# Patient Record
Sex: Female | Born: 1960 | Race: White | Hispanic: No | Marital: Married | Smoking: Never smoker
Health system: Southern US, Community
[De-identification: ages and names within clinical notes are randomized; demographics above are authoritative.]

## PROBLEM LIST (undated history)

## (undated) DIAGNOSIS — I10 Essential (primary) hypertension: Secondary | ICD-10-CM

## (undated) HISTORY — DX: Essential (primary) hypertension: I10

---

## 2004-01-07 ENCOUNTER — Ambulatory Visit (HOSPITAL_COMMUNITY): Admission: RE | Admit: 2004-01-07 | Discharge: 2004-01-07 | Payer: Self-pay | Admitting: Obstetrics & Gynecology

## 2008-01-16 ENCOUNTER — Ambulatory Visit (HOSPITAL_COMMUNITY): Admission: RE | Admit: 2008-01-16 | Discharge: 2008-01-16 | Payer: Self-pay | Admitting: Obstetrics and Gynecology

## 2008-06-25 ENCOUNTER — Ambulatory Visit (HOSPITAL_COMMUNITY): Admission: RE | Admit: 2008-06-25 | Discharge: 2008-06-25 | Payer: Self-pay | Admitting: Obstetrics & Gynecology

## 2008-10-28 ENCOUNTER — Ambulatory Visit: Payer: Self-pay | Admitting: Orthopedic Surgery

## 2008-10-28 DIAGNOSIS — M7500 Adhesive capsulitis of unspecified shoulder: Secondary | ICD-10-CM | POA: Insufficient documentation

## 2008-11-08 ENCOUNTER — Encounter: Payer: Self-pay | Admitting: Orthopedic Surgery

## 2008-11-08 ENCOUNTER — Encounter (HOSPITAL_COMMUNITY): Admission: RE | Admit: 2008-11-08 | Discharge: 2008-11-24 | Payer: Self-pay | Admitting: Orthopedic Surgery

## 2008-11-26 ENCOUNTER — Encounter (HOSPITAL_COMMUNITY): Admission: RE | Admit: 2008-11-26 | Discharge: 2008-12-26 | Payer: Self-pay | Admitting: Orthopedic Surgery

## 2008-12-06 ENCOUNTER — Encounter: Payer: Self-pay | Admitting: Orthopedic Surgery

## 2008-12-13 ENCOUNTER — Ambulatory Visit: Payer: Self-pay | Admitting: Orthopedic Surgery

## 2008-12-27 ENCOUNTER — Encounter (HOSPITAL_COMMUNITY): Admission: RE | Admit: 2008-12-27 | Discharge: 2009-01-26 | Payer: Self-pay | Admitting: Orthopedic Surgery

## 2008-12-31 ENCOUNTER — Encounter: Payer: Self-pay | Admitting: Orthopedic Surgery

## 2009-01-17 ENCOUNTER — Encounter: Payer: Self-pay | Admitting: Orthopedic Surgery

## 2009-02-14 ENCOUNTER — Ambulatory Visit: Payer: Self-pay | Admitting: Orthopedic Surgery

## 2010-03-28 NOTE — Miscellaneous (Signed)
Summary: PT intial eval.  PT intial eval.   Imported By: Curtis Sites 11/25/2008 08:28:13  _____________________________________________________________________  External Attachment:    Type:   Image     Comment:   External Document

## 2012-08-26 ENCOUNTER — Ambulatory Visit: Payer: 59

## 2012-08-26 ENCOUNTER — Ambulatory Visit (INDEPENDENT_AMBULATORY_CARE_PROVIDER_SITE_OTHER): Payer: 59 | Admitting: Orthopedic Surgery

## 2012-08-26 VITALS — BP 140/82 | Ht 67.0 in | Wt 147.0 lb

## 2012-08-26 DIAGNOSIS — M75102 Unspecified rotator cuff tear or rupture of left shoulder, not specified as traumatic: Secondary | ICD-10-CM

## 2012-08-26 DIAGNOSIS — M719 Bursopathy, unspecified: Secondary | ICD-10-CM

## 2012-08-26 DIAGNOSIS — M25519 Pain in unspecified shoulder: Secondary | ICD-10-CM

## 2012-08-26 DIAGNOSIS — M25512 Pain in left shoulder: Secondary | ICD-10-CM

## 2012-08-26 NOTE — Patient Instructions (Addendum)
You have received a steroid shot. 15% of patients experience increased pain at the injection site with in the next 24 hours. This is best treated with ice and tylenol extra strength 2 tabs every 8 hours. If you are still having pain please call the office.  HOME EXERICSES

## 2012-08-27 DIAGNOSIS — M25512 Pain in left shoulder: Secondary | ICD-10-CM | POA: Insufficient documentation

## 2012-08-27 DIAGNOSIS — M75102 Unspecified rotator cuff tear or rupture of left shoulder, not specified as traumatic: Secondary | ICD-10-CM | POA: Insufficient documentation

## 2012-08-27 NOTE — Progress Notes (Signed)
Patient ID: Gina Reid, female   DOB: 02/11/1961, 52 y.o.   MRN: [id number] Chief Complaint  Patient presents with  . Shoulder Pain    Left shoulder pain and stiffness    History this is a 52 year old female previous history of adhesive capsulitis of left shoulder presents with left shoulder pain and stiffness since December with no history of trauma. She is sharp pain with certain movements it appears to bother her more at when she reaches behind her, internal rotation, she has lost some external rotation. She has no weakness she does complain of 7/10 pain but does not like taking medicine even Tylenol or Advil  Review of systems is a very healthy person she has a negative review of systems.  No allergies  No medical problems  No previous surgeries  Medication is Loestrin  Family history negative  Married, Manufacturing systems engineer. She does not smoke rarely drinks has a little bit of caffeine has her 4 year college degree no street drugs reported.  Vital signs are stable normal ectomorphic body habitus BP 140/82  Ht 5\' 7"  (1.702 m)  Wt 147 lb (66.679 kg)  BMI 23.02 kg/m2  LMP 08/26/2012  General appearance: Development, nutrition are normal. Body habitus ectomorphic No gross deformities are noted and grooming normal.  Peripheral vascular system no swelling or varicose veins are noted and pulses are palpable without tenderness, temperature warm to touch no edema.  No palpable lymph nodes are noted in the cervical area or axillae.  The skin overlying the right and left shoulder cervical and thoracic spine is normal without rash, lesion or ulceration  . And pathologic reflexes such as Hoffman sign are negative.  Sensation remains normal.  The patient is oriented to person place and time, the mood and affect are normal  Ambulation remains normal  Cervical spine no mass or tenderness. Range of motion is normal. Muscle tone is normal. Skin is normal.  Right shoulder inspection  reveals no tenderness or malalignment. There is no crepitation. The range of motion remains full flexion internal and external rotation. Stability tests are normal in abduction external rotation inferior subluxation test as well as the posterior stress test. Manual muscle testing of the supraspinatus, internal and external rotators 5 over 5  Impingement sign is normal, Hawkins maneuver normal, a.c. joint stress test normal.  Left shoulder  inspection reveals no tenderness or malalignment. There is no crepitation. The range of motion remains full flexion internal and external rotation. Stability tests are normal in abduction external rotation inferior subluxation test as well as the posterior stress test. Manual muscle testing of the supraspinatus, internal and external rotators 5 over 5  Impingement sign is normal, Hawkins maneuver normal, a.c. joint stress test normal.  X-rays are negative. Impingement syndrome left shoulder  Patient prefers to do home exercise program which we reviewed. We gave her a subacromial injection she refuses any medication.  Followup 6 weeks check range of motion especially internal rotation  Subacromial Shoulder Injection Procedure Note  Pre-operative Diagnosis: left RC Syndrome  Post-operative Diagnosis: same  Indications: pain   Anesthesia: ethyl chloride   Procedure Details   Verbal consent was obtained for the procedure. The shoulder was prepped withalcohol and the skin was anesthetized. A 20 gauge needle was advanced into the subacromial space through posterior approach without difficulty  The space was then injected with 3 ml 1% lidocaine and 1 ml of depomedrol. The injection site was cleansed with isopropyl alcohol and a dressing was applied.  Complications:  None; patient tolerated the procedure well.

## 2012-10-09 ENCOUNTER — Ambulatory Visit: Payer: 59 | Admitting: Orthopedic Surgery

## 2013-04-17 ENCOUNTER — Other Ambulatory Visit: Payer: Self-pay | Admitting: Adult Health

## 2013-04-17 DIAGNOSIS — Z1231 Encounter for screening mammogram for malignant neoplasm of breast: Secondary | ICD-10-CM

## 2013-04-21 ENCOUNTER — Ambulatory Visit (HOSPITAL_COMMUNITY)
Admission: RE | Admit: 2013-04-21 | Discharge: 2013-04-21 | Disposition: A | Discharge status: Home / Self Care | Payer: 59 | Source: Ambulatory Visit | Attending: Adult Health | Admitting: Adult Health

## 2013-04-21 DIAGNOSIS — Z1231 Encounter for screening mammogram for malignant neoplasm of breast: Secondary | ICD-10-CM | POA: Insufficient documentation

## 2013-04-22 ENCOUNTER — Telehealth: Payer: Self-pay | Admitting: Adult Health

## 2013-04-22 NOTE — Telephone Encounter (Signed)
Pt aware mammogram normal 

## 2014-04-12 ENCOUNTER — Other Ambulatory Visit: Payer: Self-pay | Admitting: Adult Health

## 2014-04-12 DIAGNOSIS — Z1231 Encounter for screening mammogram for malignant neoplasm of breast: Secondary | ICD-10-CM

## 2014-05-14 ENCOUNTER — Ambulatory Visit (HOSPITAL_COMMUNITY)
Admission: RE | Admit: 2014-05-14 | Discharge: 2014-05-14 | Disposition: A | Discharge status: Home / Self Care | Payer: 59 | Source: Ambulatory Visit | Attending: Adult Health | Admitting: Adult Health

## 2014-05-14 DIAGNOSIS — Z1231 Encounter for screening mammogram for malignant neoplasm of breast: Secondary | ICD-10-CM | POA: Insufficient documentation

## 2015-04-08 ENCOUNTER — Other Ambulatory Visit: Payer: Self-pay | Admitting: Adult Health

## 2015-04-08 DIAGNOSIS — Z1231 Encounter for screening mammogram for malignant neoplasm of breast: Secondary | ICD-10-CM

## 2015-05-16 ENCOUNTER — Ambulatory Visit (HOSPITAL_COMMUNITY)
Admission: RE | Admit: 2015-05-16 | Discharge: 2015-05-16 | Disposition: A | Discharge status: Home / Self Care | Payer: 59 | Source: Ambulatory Visit | Attending: Adult Health | Admitting: Adult Health

## 2015-05-16 DIAGNOSIS — R928 Other abnormal and inconclusive findings on diagnostic imaging of breast: Secondary | ICD-10-CM | POA: Insufficient documentation

## 2015-05-16 DIAGNOSIS — Z1231 Encounter for screening mammogram for malignant neoplasm of breast: Secondary | ICD-10-CM | POA: Insufficient documentation

## 2015-05-17 ENCOUNTER — Other Ambulatory Visit: Payer: Self-pay | Admitting: Adult Health

## 2015-05-17 DIAGNOSIS — R928 Other abnormal and inconclusive findings on diagnostic imaging of breast: Secondary | ICD-10-CM

## 2015-05-24 ENCOUNTER — Ambulatory Visit (HOSPITAL_COMMUNITY)
Admission: RE | Admit: 2015-05-24 | Discharge: 2015-05-24 | Disposition: A | Discharge status: Home / Self Care | Payer: 59 | Source: Ambulatory Visit | Attending: Adult Health | Admitting: Adult Health

## 2015-05-24 DIAGNOSIS — R928 Other abnormal and inconclusive findings on diagnostic imaging of breast: Secondary | ICD-10-CM | POA: Diagnosis not present

## 2015-07-20 ENCOUNTER — Other Ambulatory Visit: Payer: Self-pay | Admitting: Women's Health

## 2015-07-20 MED ORDER — METRONIDAZOLE 0.75 % VA GEL: 1.0000 | Freq: Every day | VAGINAL | Status: DC

## 2015-07-20 NOTE — Telephone Encounter (Signed)
Complaint of a yellowish malodorous vaginal discharge, started a few days ago, post coital

## 2016-08-09 ENCOUNTER — Telehealth: Payer: Self-pay | Admitting: Adult Health

## 2016-08-09 MED ORDER — NORETHIN-ETH ESTRAD-FE BIPHAS 1 MG-10 MCG / 10 MCG PO TABS: 1.0000 | ORAL_TABLET | Freq: Every day | ORAL | 11 refills | Status: DC

## 2016-08-09 NOTE — Telephone Encounter (Signed)
Needs refill on lo loestrin, [facility]

## 2017-03-08 ENCOUNTER — Other Ambulatory Visit: Payer: Self-pay | Admitting: Adult Health

## 2017-03-08 DIAGNOSIS — Z1231 Encounter for screening mammogram for malignant neoplasm of breast: Secondary | ICD-10-CM

## 2017-05-24 ENCOUNTER — Ambulatory Visit (HOSPITAL_COMMUNITY)
Admission: RE | Admit: 2017-05-24 | Discharge: 2017-05-24 | Disposition: A | Discharge status: Home / Self Care | Payer: 59 | Source: Ambulatory Visit | Attending: Adult Health | Admitting: Adult Health

## 2017-05-24 DIAGNOSIS — Z1231 Encounter for screening mammogram for malignant neoplasm of breast: Secondary | ICD-10-CM | POA: Insufficient documentation

## 2017-06-28 ENCOUNTER — Ambulatory Visit (INDEPENDENT_AMBULATORY_CARE_PROVIDER_SITE_OTHER): Payer: 59 | Admitting: Adult Health

## 2017-06-28 ENCOUNTER — Encounter (INDEPENDENT_AMBULATORY_CARE_PROVIDER_SITE_OTHER): Payer: Self-pay

## 2017-06-28 ENCOUNTER — Encounter: Payer: Self-pay | Admitting: Adult Health

## 2017-06-28 VITALS — BP 178/98 | HR 84 | Ht 67.0 in | Wt 159.6 lb

## 2017-06-28 DIAGNOSIS — N951 Menopausal and female climacteric states: Secondary | ICD-10-CM

## 2017-06-28 DIAGNOSIS — I10 Essential (primary) hypertension: Secondary | ICD-10-CM

## 2017-06-28 MED ORDER — HYDROCHLOROTHIAZIDE 12.5 MG PO CAPS: 12.5000 mg | ORAL_CAPSULE | Freq: Every day | ORAL | 2 refills | Status: DC

## 2017-06-28 NOTE — Patient Instructions (Signed)
DASH Eating Plan DASH stands for "Dietary Approaches to Stop Hypertension." The DASH eating plan is a healthy eating plan that has been shown to reduce high blood pressure (hypertension). It may also reduce your risk for type 2 diabetes, heart disease, and stroke. The DASH eating plan may also help with weight loss. What are tips for following this plan? General guidelines  Avoid eating more than 2,300 mg (milligrams) of salt (sodium) a day. If you have hypertension, you may need to reduce your sodium intake to 1,500 mg a day.  Limit alcohol intake to no more than 1 drink a day for nonpregnant women and 2 drinks a day for men. One drink equals 12 oz of beer, 5 oz of wine, or 1 oz of hard liquor.  Work with your health care provider to maintain a healthy body weight or to lose weight. Ask what an ideal weight is for you.  Get at least 30 minutes of exercise that causes your heart to beat faster (aerobic exercise) most days of the week. Activities may include walking, swimming, or biking.  Work with your health care provider or diet and nutrition specialist (dietitian) to adjust your eating plan to your individual calorie needs. Reading food labels  Check food labels for the amount of sodium per serving. Choose foods with less than 5 percent of the Daily Value of sodium. Generally, foods with less than 300 mg of sodium per serving fit into this eating plan.  To find whole grains, look for the word "whole" as the first word in the ingredient list. Shopping  Buy products labeled as "low-sodium" or "no salt added."  Buy fresh foods. Avoid canned foods and premade or frozen meals. Cooking  Avoid adding salt when cooking. Use salt-free seasonings or herbs instead of table salt or sea salt. Check with your health care provider or pharmacist before using salt substitutes.  Do not fry foods. Cook foods using healthy methods such as baking, boiling, grilling, and broiling instead.  Cook with  heart-healthy oils, such as olive, canola, soybean, or sunflower oil. Meal planning   Eat a balanced diet that includes: ? 5 or more servings of fruits and vegetables each day. At each meal, try to fill half of your plate with fruits and vegetables. ? Up to 6-8 servings of whole grains each day. ? Less than 6 oz of lean meat, poultry, or fish each day. A 3-oz serving of meat is about the same size as a deck of cards. One egg equals 1 oz. ? 2 servings of low-fat dairy each day. ? A serving of nuts, seeds, or beans 5 times each week. ? Heart-healthy fats. Healthy fats called Omega-3 fatty acids are found in foods such as flaxseeds and coldwater fish, like sardines, salmon, and mackerel.  Limit how much you eat of the following: ? Canned or prepackaged foods. ? Food that is high in trans fat, such as fried foods. ? Food that is high in saturated fat, such as fatty meat. ? Sweets, desserts, sugary drinks, and other foods with added sugar. ? Full-fat dairy products.  Do not salt foods before eating.  Try to eat at least 2 vegetarian meals each week.  Eat more home-cooked food and less restaurant, buffet, and fast food.  When eating at a restaurant, ask that your food be prepared with less salt or no salt, if possible. What foods are recommended? The items listed may not be a complete list. Talk with your dietitian about what   dietary choices are best for you. Grains Whole-grain or whole-wheat bread. Whole-grain or whole-wheat pasta. Brown rice. Oatmeal. Quinoa. Bulgur. Whole-grain and low-sodium cereals. Pita bread. Low-fat, low-sodium crackers. Whole-wheat flour tortillas. Vegetables Fresh or frozen vegetables (raw, steamed, roasted, or grilled). Low-sodium or reduced-sodium tomato and vegetable juice. Low-sodium or reduced-sodium tomato sauce and tomato paste. Low-sodium or reduced-sodium canned vegetables. Fruits All fresh, dried, or frozen fruit. Canned fruit in natural juice (without  added sugar). Meat and other protein foods Skinless chicken or turkey. Ground chicken or turkey. Pork with fat trimmed off. Fish and seafood. Egg whites. Dried beans, peas, or lentils. Unsalted nuts, nut butters, and seeds. Unsalted canned beans. Lean cuts of beef with fat trimmed off. Low-sodium, lean deli meat. Dairy Low-fat (1%) or fat-free (skim) milk. Fat-free, low-fat, or reduced-fat cheeses. Nonfat, low-sodium ricotta or cottage cheese. Low-fat or nonfat yogurt. Low-fat, low-sodium cheese. Fats and oils Soft margarine without trans fats. Vegetable oil. Low-fat, reduced-fat, or light mayonnaise and salad dressings (reduced-sodium). Canola, safflower, olive, soybean, and sunflower oils. Avocado. Seasoning and other foods Herbs. Spices. Seasoning mixes without salt. Unsalted popcorn and pretzels. Fat-free sweets. What foods are not recommended? The items listed may not be a complete list. Talk with your dietitian about what dietary choices are best for you. Grains Baked goods made with fat, such as croissants, muffins, or some breads. Dry pasta or rice meal packs. Vegetables Creamed or fried vegetables. Vegetables in a cheese sauce. Regular canned vegetables (not low-sodium or reduced-sodium). Regular canned tomato sauce and paste (not low-sodium or reduced-sodium). Regular tomato and vegetable juice (not low-sodium or reduced-sodium). Pickles. Olives. Fruits Canned fruit in a light or heavy syrup. Fried fruit. Fruit in cream or butter sauce. Meat and other protein foods Fatty cuts of meat. Ribs. Fried meat. Bacon. Sausage. Bologna and other processed lunch meats. Salami. Fatback. Hotdogs. Bratwurst. Salted nuts and seeds. Canned beans with added salt. Canned or smoked fish. Whole eggs or egg yolks. Chicken or turkey with skin. Dairy Whole or 2% milk, cream, and half-and-half. Whole or full-fat cream cheese. Whole-fat or sweetened yogurt. Full-fat cheese. Nondairy creamers. Whipped toppings.  Processed cheese and cheese spreads. Fats and oils Butter. Stick margarine. Lard. Shortening. Ghee. Bacon fat. Tropical oils, such as coconut, palm kernel, or palm oil. Seasoning and other foods Salted popcorn and pretzels. Onion salt, garlic salt, seasoned salt, table salt, and sea salt. Worcestershire sauce. Tartar sauce. Barbecue sauce. Teriyaki sauce. Soy sauce, including reduced-sodium. Steak sauce. Canned and packaged gravies. Fish sauce. Oyster sauce. Cocktail sauce. Horseradish that you find on the shelf. Ketchup. Mustard. Meat flavorings and tenderizers. Bouillon cubes. Hot sauce and Tabasco sauce. Premade or packaged marinades. Premade or packaged taco seasonings. Relishes. Regular salad dressings. Where to find more information:  National Heart, Lung, and Blood Institute: www.nhlbi.nih.gov  American Heart Association: www.heart.org Summary  The DASH eating plan is a healthy eating plan that has been shown to reduce high blood pressure (hypertension). It may also reduce your risk for type 2 diabetes, heart disease, and stroke.  With the DASH eating plan, you should limit salt (sodium) intake to 2,300 mg a day. If you have hypertension, you may need to reduce your sodium intake to 1,500 mg a day.  When on the DASH eating plan, aim to eat more fresh fruits and vegetables, whole grains, lean proteins, low-fat dairy, and heart-healthy fats.  Work with your health care provider or diet and nutrition specialist (dietitian) to adjust your eating plan to your individual   calorie needs. This information is not intended to replace advice given to you by your health care provider. Make sure you discuss any questions you have with your health care provider. Document Released: 02/01/2011 Document Revised: 02/06/2016 Document Reviewed: 02/06/2016 Elsevier Interactive Patient Education  2018 Elsevier Inc.  Hypertension Hypertension, commonly called high blood pressure, is when the force of blood  pumping through the arteries is too strong. The arteries are the blood vessels that carry blood from the heart throughout the body. Hypertension forces the heart to work harder to pump blood and may cause arteries to become narrow or stiff. Having untreated or uncontrolled hypertension can cause heart attacks, strokes, kidney disease, and other problems. A blood pressure reading consists of a higher number over a lower number. Ideally, your blood pressure should be below 120/80. The first ("top") number is called the systolic pressure. It is a measure of the pressure in your arteries as your heart beats. The second ("bottom") number is called the diastolic pressure. It is a measure of the pressure in your arteries as the heart relaxes. What are the causes? The cause of this condition is not known. What increases the risk? Some risk factors for high blood pressure are under your control. Others are not. Factors you can change  Smoking.  Having type 2 diabetes mellitus, high cholesterol, or both.  Not getting enough exercise or physical activity.  Being overweight.  Having too much fat, sugar, calories, or salt (sodium) in your diet.  Drinking too much alcohol. Factors that are difficult or impossible to change  Having chronic kidney disease.  Having a family history of high blood pressure.  Age. Risk increases with age.  Race. You may be at higher risk if you are African-American.  Gender. Men are at higher risk than women before age 45. After age 65, women are at higher risk than men.  Having obstructive sleep apnea.  Stress. What are the signs or symptoms? Extremely high blood pressure (hypertensive crisis) may cause:  Headache.  Anxiety.  Shortness of breath.  Nosebleed.  Nausea and vomiting.  Severe chest pain.  Jerky movements you cannot control (seizures).  How is this diagnosed? This condition is diagnosed by measuring your blood pressure while you are  seated, with your arm resting on a surface. The cuff of the blood pressure monitor will be placed directly against the skin of your upper arm at the level of your heart. It should be measured at least twice using the same arm. Certain conditions can cause a difference in blood pressure between your right and left arms. Certain factors can cause blood pressure readings to be lower or higher than normal (elevated) for a short period of time:  When your blood pressure is higher when you are in a health care provider's office than when you are at home, this is called white coat hypertension. Most people with this condition do not need medicines.  When your blood pressure is higher at home than when you are in a health care provider's office, this is called masked hypertension. Most people with this condition may need medicines to control blood pressure.  If you have a high blood pressure reading during one visit or you have normal blood pressure with other risk factors:  You may be asked to return on a different day to have your blood pressure checked again.  You may be asked to monitor your blood pressure at home for 1 week or longer.  If you are   diagnosed with hypertension, you may have other blood or imaging tests to help your health care provider understand your overall risk for other conditions. How is this treated? This condition is treated by making healthy lifestyle changes, such as eating healthy foods, exercising more, and reducing your alcohol intake. Your health care provider may prescribe medicine if lifestyle changes are not enough to get your blood pressure under control, and if:  Your systolic blood pressure is above 130.  Your diastolic blood pressure is above 80.  Your personal target blood pressure may vary depending on your medical conditions, your age, and other factors. Follow these instructions at home: Eating and drinking  Eat a diet that is high in fiber and potassium,  and low in sodium, added sugar, and fat. An example eating plan is called the DASH (Dietary Approaches to Stop Hypertension) diet. To eat this way: ? Eat plenty of fresh fruits and vegetables. Try to fill half of your plate at each meal with fruits and vegetables. ? Eat whole grains, such as whole wheat pasta, brown rice, or whole grain bread. Fill about one quarter of your plate with whole grains. ? Eat or drink low-fat dairy products, such as skim milk or low-fat yogurt. ? Avoid fatty cuts of meat, processed or cured meats, and poultry with skin. Fill about one quarter of your plate with lean proteins, such as fish, chicken without skin, beans, eggs, and tofu. ? Avoid premade and processed foods. These tend to be higher in sodium, added sugar, and fat.  Reduce your daily sodium intake. Most people with hypertension should eat less than 1,500 mg of sodium a day.  Limit alcohol intake to no more than 1 drink a day for nonpregnant women and 2 drinks a day for men. One drink equals 12 oz of beer, 5 oz of wine, or 1 oz of hard liquor. Lifestyle  Work with your health care provider to maintain a healthy body weight or to lose weight. Ask what an ideal weight is for you.  Get at least 30 minutes of exercise that causes your heart to beat faster (aerobic exercise) most days of the week. Activities may include walking, swimming, or biking.  Include exercise to strengthen your muscles (resistance exercise), such as pilates or lifting weights, as part of your weekly exercise routine. Try to do these types of exercises for 30 minutes at least 3 days a week.  Do not use any products that contain nicotine or tobacco, such as cigarettes and e-cigarettes. If you need help quitting, ask your health care provider.  Monitor your blood pressure at home as told by your health care provider.  Keep all follow-up visits as told by your health care provider. This is important. Medicines  Take over-the-counter and  prescription medicines only as told by your health care provider. Follow directions carefully. Blood pressure medicines must be taken as prescribed.  Do not skip doses of blood pressure medicine. Doing this puts you at risk for problems and can make the medicine less effective.  Ask your health care provider about side effects or reactions to medicines that you should watch for. Contact a health care provider if:  You think you are having a reaction to a medicine you are taking.  You have headaches that keep coming back (recurring).  You feel dizzy.  You have swelling in your ankles.  You have trouble with your vision. Get help right away if:  You develop a severe headache or confusion.    You have unusual weakness or numbness.  You feel faint.  You have severe pain in your chest or abdomen.  You vomit repeatedly.  You have trouble breathing. Summary  Hypertension is when the force of blood pumping through your arteries is too strong. If this condition is not controlled, it may put you at risk for serious complications.  Your personal target blood pressure may vary depending on your medical conditions, your age, and other factors. For most people, a normal blood pressure is less than 120/80.  Hypertension is treated with lifestyle changes, medicines, or a combination of both. Lifestyle changes include weight loss, eating a healthy, low-sodium diet, exercising more, and limiting alcohol. This information is not intended to replace advice given to you by your health care provider. Make sure you discuss any questions you have with your health care provider. Document Released: 02/12/2005 Document Revised: 01/11/2016 Document Reviewed: 01/11/2016 Elsevier Interactive Patient Education  2018 Elsevier Inc.  

## 2017-06-28 NOTE — Progress Notes (Signed)
  Subjective:     Patient ID: Gina Reid, female   DOB: 1960-03-11, 57 y.o.   MRN: [id number]  HPI Gina Reid is a 57 year old white female, married, G3P3 in for BP check, she went to give blood and BP was elevated.She has been checking at home and range is 150/85 to 167/86, had one early morning at 123/81.She is active and works at [employer]. Her mom may have had hypertension, she did have a stroke and died in her 26's.   Review of Systems Patient denies any headaches,dizziness, fatigue, blurred vision, shortness of breath, chest pain, abdominal pain, problems with bowel movements, urination, or intercourse. No joint pain or mood swings.+hot flashes, still some lite spotting at end of OC pack.    Objective:   Physical Exam BP (!) 178/98 (BP Location: Right Arm, Patient Position: Sitting, Cuff Size: Normal)   Pulse 84   Ht  (1.702 m)   Wt 159 lb 9.6 oz (72.4 kg)   BMI 25.00 kg/m  Skin warm and dry. Neck: mid line trachea, normal thyroid, good ROM, no lymphadenopathy noted. Lungs: clear to ausculation bilaterally. Cardiovascular: regular rate and rhythm.No swelling in feet,ankles or hands. PHQ 2 score 0. Will start microzide and recheck BP in 2 weeks.    Assessment:     1. Essential hypertension       Plan:     Meds ordered this encounter  Medications  . hydrochlorothiazide (MICROZIDE) 12.5 MG capsule    Sig: Take 1 capsule (12.5 mg total) by mouth daily.    Dispense:  30 capsule    Refill:  2    Order Specific Question:   Supervising Provider    Answer:   Duane Lope H [[id number]]  Return in 2 weeks for BP check and labs Review handout on hypertension and DASH diet.

## 2017-07-12 ENCOUNTER — Encounter: Payer: Self-pay | Admitting: Adult Health

## 2017-07-12 ENCOUNTER — Ambulatory Visit (INDEPENDENT_AMBULATORY_CARE_PROVIDER_SITE_OTHER): Payer: 59 | Admitting: Adult Health

## 2017-07-12 VITALS — BP 146/88 | HR 66 | Ht 67.0 in | Wt 158.0 lb

## 2017-07-12 DIAGNOSIS — I10 Essential (primary) hypertension: Secondary | ICD-10-CM | POA: Diagnosis not present

## 2017-07-12 DIAGNOSIS — Z13220 Encounter for screening for lipoid disorders: Secondary | ICD-10-CM

## 2017-07-12 MED ORDER — HYDROCHLOROTHIAZIDE 12.5 MG PO CAPS: 12.5000 mg | ORAL_CAPSULE | Freq: Two times a day (BID) | ORAL | 2 refills | Status: DC

## 2017-07-12 NOTE — Progress Notes (Signed)
  Subjective:     Patient ID: Gina Reid, female   DOB: [birth year], 57 y.o.   MRN: [id number]  HPI Gina Reid is a 57 year old white female, married in for a BP check after starting Microzide 12.5 mg.BP at home this morning was 117/85, but by her log is is more elevated in afternoon and evening.She is on lo loestrin for cycle control and hot flashes.  Review of Systems No headaches or dizziness Reviewed past medical,surgical, social and family history. Reviewed medications and allergies.     Objective:   Physical Exam BP (!) 146/88 (BP Location: Left Arm, Cuff Size: Normal)   Pulse 66   Ht  (1.702 m)   Wt 158 lb (71.7 kg)   BMI 24.75 kg/m  Skin warm and dry. Lungs: clear to ausculation bilaterally. Cardiovascular: regular rate and rhythm.No swelling in ankles or feet.   Will increase Microzide 12.5 mg to 1 bid and check labs today.  Assessment:     1. Essential hypertension   2. Screening cholesterol level       Plan:    Check CBC,CMP,TSH and lipids Meds ordered this encounter  Medications  . hydrochlorothiazide (MICROZIDE) 12.5 MG capsule    Sig: Take 1 capsule (12.5 mg total) by mouth 2 (two) times daily.    Dispense:  60 capsule    Refill:  2    Order Specific Question:   Supervising Provider    Answer:   Despina Hidden, LUTHER H [[id number]]  F/U in 4 weeks for pap and physical

## 2017-07-13 LAB — COMPREHENSIVE METABOLIC PANEL
ALT: 24 IU/L (ref 0–32)
AST: 21 IU/L (ref 0–40)
Albumin/Globulin Ratio: 1.8 (ref 1.2–2.2)
Albumin: 4.2 g/dL (ref 3.5–5.5)
Alkaline Phosphatase: 69 IU/L (ref 39–117)
BUN/Creatinine Ratio: 19 (ref 9–23)
BUN: 15 mg/dL (ref 6–24)
Bilirubin Total: 0.3 mg/dL (ref 0.0–1.2)
CO2: 26 mmol/L (ref 20–29)
CREATININE: 0.77 mg/dL (ref 0.57–1.00)
Calcium: 9 mg/dL (ref 8.7–10.2)
Chloride: 102 mmol/L (ref 96–106)
GFR calc Af Amer: 101 mL/min/{1.73_m2} (ref >59)
GFR, EST NON AFRICAN AMERICAN: 87 mL/min/{1.73_m2} (ref >59)
GLOBULIN, TOTAL: 2.3 g/dL (ref 1.5–4.5)
Glucose: 95 mg/dL (ref 65–99)
Potassium: 4 mmol/L (ref 3.5–5.2)
SODIUM: 142 mmol/L (ref 134–144)
Total Protein: 6.5 g/dL (ref 6.0–8.5)

## 2017-07-13 LAB — LIPID PANEL
CHOLESTEROL TOTAL: 163 mg/dL (ref 100–199)
Chol/HDL Ratio: 3.2 ratio (ref 0.0–4.4)
HDL: 51 mg/dL (ref >39)
LDL CALC: 93 mg/dL (ref 0–99)
TRIGLYCERIDES: 96 mg/dL (ref 0–149)
VLDL CHOLESTEROL CAL: 19 mg/dL (ref 5–40)

## 2017-07-13 LAB — CBC
HEMATOCRIT: 41 % (ref 34.0–46.6)
Hemoglobin: 14.1 g/dL (ref 11.1–15.9)
MCH: 28.8 pg (ref 26.6–33.0)
MCHC: 34.4 g/dL (ref 31.5–35.7)
MCV: 84 fL (ref 79–97)
Platelets: 264 10*3/uL (ref 150–379)
RBC: 4.9 x10E6/uL (ref 3.77–5.28)
RDW: 13.3 % (ref 12.3–15.4)
WBC: 5.8 10*3/uL (ref 3.4–10.8)

## 2017-07-13 LAB — TSH: TSH: 0.726 u[IU]/mL (ref 0.450–4.500)

## 2017-08-09 ENCOUNTER — Other Ambulatory Visit (HOSPITAL_COMMUNITY)
Admission: RE | Admit: 2017-08-09 | Discharge: 2017-08-09 | Disposition: A | Discharge status: Home / Self Care | Payer: 59 | Source: Ambulatory Visit | Attending: Adult Health | Admitting: Adult Health

## 2017-08-09 ENCOUNTER — Ambulatory Visit (INDEPENDENT_AMBULATORY_CARE_PROVIDER_SITE_OTHER): Payer: 59 | Admitting: Adult Health

## 2017-08-09 ENCOUNTER — Other Ambulatory Visit: Payer: Self-pay

## 2017-08-09 ENCOUNTER — Encounter: Payer: Self-pay | Admitting: Adult Health

## 2017-08-09 VITALS — BP 156/91 | HR 77 | Resp 18 | Ht 67.0 in | Wt 160.0 lb

## 2017-08-09 DIAGNOSIS — Z1212 Encounter for screening for malignant neoplasm of rectum: Secondary | ICD-10-CM

## 2017-08-09 DIAGNOSIS — Z01419 Encounter for gynecological examination (general) (routine) without abnormal findings: Secondary | ICD-10-CM | POA: Diagnosis not present

## 2017-08-09 DIAGNOSIS — R232 Flushing: Secondary | ICD-10-CM

## 2017-08-09 DIAGNOSIS — I10 Essential (primary) hypertension: Secondary | ICD-10-CM | POA: Diagnosis not present

## 2017-08-09 DIAGNOSIS — Z1211 Encounter for screening for malignant neoplasm of colon: Secondary | ICD-10-CM | POA: Diagnosis not present

## 2017-08-09 DIAGNOSIS — Z01411 Encounter for gynecological examination (general) (routine) with abnormal findings: Secondary | ICD-10-CM | POA: Diagnosis not present

## 2017-08-09 DIAGNOSIS — Z79890 Hormone replacement therapy (postmenopausal): Secondary | ICD-10-CM | POA: Diagnosis not present

## 2017-08-09 LAB — HEMOCCULT GUIAC POC 1CARD (OFFICE): Fecal Occult Blood, POC: NEGATIVE

## 2017-08-09 MED ORDER — CONJ ESTROGENS-BAZEDOXIFENE 0.45-20 MG PO TABS
ORAL_TABLET | ORAL | 0 refills | Status: DC
Start: 2017-08-09 — End: 2018-07-16

## 2017-08-09 MED ORDER — HYDROCHLOROTHIAZIDE 12.5 MG PO CAPS: 12.5000 mg | ORAL_CAPSULE | Freq: Two times a day (BID) | ORAL | 12 refills | Status: DC

## 2017-08-09 NOTE — Progress Notes (Signed)
Patient ID: Gina Reid, female   DOB: 02/12/1961, 57 y.o.   MRN: [id number] History of Present Illness: Gina Reid is a 57 year old white female,married, G3P3, in for well woman gyn exam and pap.She still has periods and is on low dose OCs for hot flashes. BPs are good, this morning 109/82, and she take them about 4 x daily most days.   Current Medications, Allergies, Past Medical History, Past Surgical History, Family History and Social History were reviewed in [facility] electronic medical record.     Review of Systems: Patient denies any headaches, hearing loss, fatigue, blurred vision, shortness of breath, chest pain, abdominal pain, problems with bowel movements, urination, or intercourse. No joint pain. +hot flashes, some moodiness   Physical Exam:BP (!) 156/91 (BP Location: Right Arm, Patient Position: Sitting, Cuff Size: Normal)   Pulse 77   Resp 18   Ht 5\' 7"  (1.702 m)   Wt 160 lb (72.6 kg)   LMP 07/29/2017   BMI 25.06 kg/m  General:  Well developed, well nourished, no acute distress Skin:  Warm and dry Neck:  Midline trachea, normal thyroid, good ROM, no lymphadenopathy Lungs; Clear to auscultation bilaterally Breast:  No dominant palpable mass, retraction, or nipple discharge Cardiovascular: Regular rate and rhythm Abdomen:  Soft, non tender, no hepatosplenomegaly Pelvic:  External genitalia is normal in appearance, no lesions.  The vagina is normal in appearance. Urethra has no lesions or masses. The cervix is bulbous,evereted at the os. Pap with HPV performed. Uterus is felt to be normal size, shape, and contour.  No adnexal masses or tenderness noted.Bladder is non tender, no masses felt. Rectal: Good sphincter tone, no polyps, or hemorrhoids felt.  Hemoccult negative. Extremities/musculoskeletal:  No swelling or varicosities noted, no clubbing or cyanosis Psych:  Alert and cooperative,seems happy PHQ 2 score 0.Finish current pack of OCs then start Duavee,  continue BP meds 1 bid.  Impression: 1. Encounter for gynecological examination with Papanicolaou smear of cervix   2. Screening for colorectal cancer   3. Essential hypertension   4. Hormone replacement therapy (HRT)       Plan: Meds ordered this encounter  Medications  . Conj Estrogens-Bazedoxifene (DUAVEE) 0.45-20 MG TABS    Sig: Take 1 daily    Dispense:  42 tablet    Refill:  0    Order Specific Question:   Supervising Provider    Answer:   Mcvicker, LUTHER H [[id number]]  . hydrochlorothiazide (MICROZIDE) 12.5 MG capsule    Sig: Take 1 capsule (12.5 mg total) by mouth 2 (two) times daily.    Dispense:  60 capsule    Refill:  12    Order Specific Question:   Supervising Provider    Answer:   Lazaro ArmsEURE, LUTHER H [[id number]]   Mammogram yearly Check insurance for cologaurd Physical in 1 year Pap in 3 if normal F/U in about 6 weeks to see how Gina RossettiDuavee is doing and BP check

## 2017-08-13 LAB — CYTOLOGY - PAP
Adequacy: ABSENT
Diagnosis: NEGATIVE
HPV: NOT DETECTED

## 2017-09-17 ENCOUNTER — Ambulatory Visit (INDEPENDENT_AMBULATORY_CARE_PROVIDER_SITE_OTHER): Payer: 59 | Admitting: Adult Health

## 2017-09-17 ENCOUNTER — Encounter: Payer: Self-pay | Admitting: Adult Health

## 2017-09-17 VITALS — BP 147/91 | HR 89 | Ht 67.0 in | Wt 158.5 lb

## 2017-09-17 DIAGNOSIS — I10 Essential (primary) hypertension: Secondary | ICD-10-CM | POA: Diagnosis not present

## 2017-09-17 DIAGNOSIS — Z79890 Hormone replacement therapy (postmenopausal): Secondary | ICD-10-CM

## 2017-09-17 NOTE — Progress Notes (Signed)
  Subjective:     Patient ID: Gina Reid, female   DOB: December 08, 1960, 57 y.o.   MRN: [id number]  HPI Gina Reid is a 57 year old white female in for BP check and ROS [facility]   Review of Systems  [facility]  Reviewed past medical,surgical, social and family history. Reviewed medications and allergies.     Objective:   Physical Exam BP (!) 147/91 (BP Location: Right Arm, Patient Position: Sitting, Cuff Size: Normal)   Pulse 89   Ht 5\' 7"  (1.702 m)   Wt 158 lb 8 oz (71.9 kg)   BMI 24.82 kg/m    Skin warm and dry.  Lungs: clear to ausculation bilaterally. Cardiovascular: regular rate and rhythm.No swelling. Reviewed her log of BPs and all are good, BP this at home was 108/78.  Assessment:     1. Essential hypertension   2. Hormone replacement therapy (HRT)       Plan:     Continue Duavee(samples given) and Microzide Keep BP log F/U in 6 months or sooner if needed

## 2018-07-16 ENCOUNTER — Telehealth: Payer: Self-pay | Admitting: Adult Health

## 2018-07-16 MED ORDER — CONJ ESTROGENS-BAZEDOXIFENE 0.45-20 MG PO TABS: ORAL_TABLET | ORAL | 12 refills | Status: DC

## 2018-07-16 NOTE — Telephone Encounter (Signed)
Pt needs refill on [facility]

## 2018-07-22 ENCOUNTER — Other Ambulatory Visit (HOSPITAL_COMMUNITY): Payer: Self-pay | Admitting: Adult Health

## 2018-07-22 DIAGNOSIS — Z1231 Encounter for screening mammogram for malignant neoplasm of breast: Secondary | ICD-10-CM

## 2018-08-11 ENCOUNTER — Other Ambulatory Visit: Payer: Self-pay | Admitting: Adult Health

## 2018-08-25 ENCOUNTER — Other Ambulatory Visit: Payer: Self-pay

## 2018-08-25 ENCOUNTER — Other Ambulatory Visit: Payer: Self-pay | Admitting: Internal Medicine

## 2018-08-25 ENCOUNTER — Telehealth: Payer: Self-pay | Admitting: Adult Health

## 2018-08-25 DIAGNOSIS — Z20822 Contact with and (suspected) exposure to covid-19: Secondary | ICD-10-CM

## 2018-08-25 DIAGNOSIS — Z20828 Contact with and (suspected) exposure to other viral communicable diseases: Secondary | ICD-10-CM

## 2018-08-25 NOTE — Telephone Encounter (Signed)
Pt had exposure to covid, order sent

## 2018-08-30 LAB — NOVEL CORONAVIRUS, NAA: SARS-CoV-2, NAA: NOT DETECTED

## 2018-10-08 ENCOUNTER — Other Ambulatory Visit: Payer: Self-pay | Admitting: Advanced Practice Midwife

## 2018-10-08 MED ORDER — DUAVEE 0.45-20 MG PO TABS: ORAL_TABLET | ORAL | 12 refills | Status: DC

## 2018-10-08 NOTE — Progress Notes (Signed)
duavee transferred to [facility] out pt pharmacy d/t insurance

## 2019-01-18 DIAGNOSIS — Z20828 Contact with and (suspected) exposure to other viral communicable diseases: Secondary | ICD-10-CM | POA: Diagnosis not present

## 2019-03-04 ENCOUNTER — Telehealth: Payer: Self-pay | Admitting: Adult Health

## 2019-03-04 MED ORDER — PROGESTERONE MICRONIZED 200 MG PO CAPS: ORAL_CAPSULE | ORAL | 3 refills | Status: DC

## 2019-03-04 MED FILL — PROGESTERONE MICRONIZED 200: 200 | 90 days supply | Qty: 90 | Fill #0

## 2019-03-04 NOTE — Telephone Encounter (Signed)
Needs rx for prometrium, sent to [facility], is off [facility], and is on estrogen

## 2019-06-01 ENCOUNTER — Other Ambulatory Visit (HOSPITAL_COMMUNITY): Payer: Self-pay | Admitting: Adult Health

## 2019-06-01 ENCOUNTER — Telehealth: Payer: Self-pay | Admitting: Adult Health

## 2019-06-01 MED ORDER — PROGESTERONE MICRONIZED 200 MG PO CAPS: ORAL_CAPSULE | ORAL | 3 refills | Status: DC

## 2019-06-01 MED ORDER — HYDROCHLOROTHIAZIDE 12.5 MG PO CAPS: ORAL_CAPSULE | ORAL | 4 refills | Status: DC

## 2019-06-01 MED ORDER — ESTROGENS CONJUGATED 0.625 MG PO TABS: ORAL_TABLET | ORAL | 3 refills | Status: DC

## 2019-06-01 MED FILL — PROGESTERONE MICRONIZED 200: 200 | 90 days supply | Qty: 90 | Fill #0

## 2019-06-01 MED FILL — HYDROCHLOROTHIAZIDE 12.5 MG: 12.5 | 90 days supply | Qty: 180 | Fill #0

## 2019-06-01 NOTE — Telephone Encounter (Signed)
Husband called pt needs refills on hydrochlorothiazide and Prometrium and premarin and she requests 90 day supply be sent to [facility]

## 2019-06-02 ENCOUNTER — Other Ambulatory Visit: Payer: Self-pay | Admitting: Adult Health

## 2019-06-02 MED ORDER — ESTRADIOL 1 MG PO TABS: 1.0000 mg | ORAL_TABLET | Freq: Every day | ORAL | 3 refills | Status: DC

## 2019-06-02 MED FILL — ESTRADIOL 1 MG TABS: 1 | 90 days supply | Qty: 90 | Fill #0

## 2019-06-02 NOTE — Progress Notes (Signed)
Premarin too expensive will rx estrace 1 mg

## 2019-08-24 MED FILL — ESTRADIOL 1 MG TABS: 1 | 90 days supply | Qty: 90 | Fill #1

## 2019-10-26 MED FILL — PROGESTERONE 200 MG CAPS: 200 | 90 days supply | Qty: 90 | Fill #1

## 2019-10-27 ENCOUNTER — Other Ambulatory Visit (HOSPITAL_COMMUNITY): Payer: Self-pay | Admitting: Adult Health

## 2019-10-27 DIAGNOSIS — Z1231 Encounter for screening mammogram for malignant neoplasm of breast: Secondary | ICD-10-CM

## 2019-10-30 ENCOUNTER — Ambulatory Visit (HOSPITAL_COMMUNITY)
Admission: RE | Admit: 2019-10-30 | Discharge: 2019-10-30 | Disposition: A | Discharge status: Home / Self Care | Payer: 59 | Source: Ambulatory Visit | Attending: Adult Health | Admitting: Adult Health

## 2019-10-30 ENCOUNTER — Other Ambulatory Visit: Payer: Self-pay

## 2019-10-30 DIAGNOSIS — Z1231 Encounter for screening mammogram for malignant neoplasm of breast: Secondary | ICD-10-CM | POA: Diagnosis not present

## 2019-11-25 MED FILL — ESTRADIOL 1 MG TABS: 1 | 90 days supply | Qty: 90 | Fill #2

## 2019-11-26 ENCOUNTER — Ambulatory Visit: Payer: 59 | Attending: Internal Medicine

## 2019-11-26 DIAGNOSIS — Z23 Encounter for immunization: Secondary | ICD-10-CM

## 2019-11-26 NOTE — Progress Notes (Signed)
   Covid-19 Vaccination Clinic  Name:  CAGNEY STEENSON    MRN: [id number] DOB: 05-17-1960  11/26/2019  Ms. Mathey was observed post Covid-19 immunization for 15 minutes without incident. She was provided with Vaccine Information Sheet and instruction to access the V-Safe system.   Ms. Blankenbeckler was instructed to call 911 with any severe reactions post vaccine: Marland Kitchen Difficulty breathing  . Swelling of face and throat  . A fast heartbeat  . A bad rash all over body  . Dizziness and weakness

## 2020-02-24 MED FILL — ESTRADIOL 1 MG TABS: 1 | 90 days supply | Qty: 90 | Fill #3

## 2020-02-24 MED FILL — HYDROCHLOROTHIAZIDE 12.5 MG: 12.5 | 90 days supply | Qty: 180 | Fill #1

## 2020-05-17 MED FILL — PROGESTERONE 200 MG CAPS: 200 | 90 days supply | Qty: 90 | Fill #2

## 2020-05-18 ENCOUNTER — Other Ambulatory Visit: Payer: Self-pay | Admitting: Adult Health

## 2020-05-18 MED ORDER — ESTRADIOL 1 MG PO TABS: 1.0000 mg | ORAL_TABLET | Freq: Every day | ORAL | 3 refills | Status: DC

## 2020-05-18 MED FILL — ESTRADIOL 1 MG TABS: 1 | 90 days supply | Qty: 90 | Fill #0

## 2020-05-18 NOTE — Progress Notes (Signed)
Refill estrace  

## 2020-08-19 ENCOUNTER — Other Ambulatory Visit (HOSPITAL_COMMUNITY): Payer: Self-pay

## 2020-08-19 ENCOUNTER — Other Ambulatory Visit: Payer: Self-pay | Admitting: Adult Health

## 2020-08-19 MED FILL — Estradiol Tab 1 MG: ORAL | 90 days supply | Qty: 90 | Fill #0 | Status: AC

## 2020-08-22 ENCOUNTER — Other Ambulatory Visit (HOSPITAL_COMMUNITY): Payer: Self-pay

## 2020-08-22 MED ORDER — HYDROCHLOROTHIAZIDE 12.5 MG PO CAPS
ORAL_CAPSULE | Freq: Two times a day (BID) | ORAL | 4 refills | Status: DC
  Filled 2020-08-22: qty 180, 90d supply, fill #0
  Filled 2021-03-20: qty 180, 90d supply, fill #1

## 2020-11-17 ENCOUNTER — Other Ambulatory Visit (HOSPITAL_COMMUNITY): Payer: Self-pay

## 2020-11-17 MED FILL — Estradiol Tab 1 MG: ORAL | 90 days supply | Qty: 90 | Fill #1 | Status: AC

## 2021-02-14 ENCOUNTER — Other Ambulatory Visit (HOSPITAL_COMMUNITY): Payer: Self-pay

## 2021-02-14 MED FILL — Estradiol Tab 1 MG: ORAL | 90 days supply | Qty: 90 | Fill #2 | Status: AC

## 2021-03-20 ENCOUNTER — Other Ambulatory Visit (HOSPITAL_COMMUNITY): Payer: Self-pay

## 2021-03-21 ENCOUNTER — Other Ambulatory Visit (HOSPITAL_COMMUNITY): Payer: Self-pay

## 2021-05-25 ENCOUNTER — Other Ambulatory Visit (HOSPITAL_COMMUNITY): Payer: Self-pay | Admitting: Adult Health

## 2021-05-25 DIAGNOSIS — Z1231 Encounter for screening mammogram for malignant neoplasm of breast: Secondary | ICD-10-CM

## 2021-06-05 ENCOUNTER — Ambulatory Visit (HOSPITAL_COMMUNITY)
Admission: RE | Admit: 2021-06-05 | Discharge: 2021-06-05 | Disposition: A | Discharge status: Home / Self Care | Payer: 59 | Source: Ambulatory Visit | Attending: Adult Health | Admitting: Adult Health

## 2021-06-05 ENCOUNTER — Other Ambulatory Visit (HOSPITAL_COMMUNITY): Payer: Self-pay | Admitting: Adult Health

## 2021-06-05 DIAGNOSIS — Z1231 Encounter for screening mammogram for malignant neoplasm of breast: Secondary | ICD-10-CM | POA: Insufficient documentation

## 2021-06-05 DIAGNOSIS — R928 Other abnormal and inconclusive findings on diagnostic imaging of breast: Secondary | ICD-10-CM

## 2021-06-06 ENCOUNTER — Encounter (HOSPITAL_COMMUNITY): Payer: Self-pay

## 2021-06-06 ENCOUNTER — Ambulatory Visit (HOSPITAL_COMMUNITY)
Admission: RE | Admit: 2021-06-06 | Discharge: 2021-06-06 | Disposition: A | Discharge status: Home / Self Care | Payer: 59 | Source: Ambulatory Visit | Attending: Adult Health | Admitting: Adult Health

## 2021-06-06 DIAGNOSIS — R928 Other abnormal and inconclusive findings on diagnostic imaging of breast: Secondary | ICD-10-CM | POA: Insufficient documentation

## 2021-06-06 DIAGNOSIS — R922 Inconclusive mammogram: Secondary | ICD-10-CM | POA: Diagnosis not present

## 2021-06-27 ENCOUNTER — Other Ambulatory Visit (HOSPITAL_COMMUNITY): Payer: 59

## 2021-06-27 ENCOUNTER — Encounter (HOSPITAL_COMMUNITY): Payer: 59

## 2021-08-09 ENCOUNTER — Other Ambulatory Visit: Payer: Self-pay | Admitting: Adult Health

## 2021-08-09 ENCOUNTER — Other Ambulatory Visit (HOSPITAL_COMMUNITY): Payer: Self-pay

## 2021-08-09 MED ORDER — DUAVEE 0.45-20 MG PO TABS
1.0000 | ORAL_TABLET | Freq: Every day | ORAL | 3 refills | Status: DC
  Filled 2021-08-09: qty 90, 90d supply, fill #0
  Filled 2021-11-10: qty 90, 90d supply, fill #1
  Filled 2022-01-29: qty 90, 90d supply, fill #2
  Filled 2022-05-18: qty 90, 90d supply, fill #3

## 2021-08-09 NOTE — Progress Notes (Signed)
Will stop estrace and Prometrium and rx duavee, has used in past with good results

## 2021-08-10 ENCOUNTER — Other Ambulatory Visit (HOSPITAL_COMMUNITY): Payer: Self-pay

## 2021-10-02 ENCOUNTER — Other Ambulatory Visit: Payer: Self-pay | Admitting: Adult Health

## 2021-10-03 ENCOUNTER — Other Ambulatory Visit (HOSPITAL_COMMUNITY): Payer: Self-pay

## 2021-10-03 MED ORDER — HYDROCHLOROTHIAZIDE 12.5 MG PO CAPS
ORAL_CAPSULE | Freq: Two times a day (BID) | ORAL | 4 refills | Status: DC
  Filled 2021-10-03: qty 180, 90d supply, fill #0
  Filled 2022-05-18: qty 180, 90d supply, fill #1

## 2021-11-10 ENCOUNTER — Other Ambulatory Visit (HOSPITAL_COMMUNITY): Payer: Self-pay

## 2021-11-11 ENCOUNTER — Other Ambulatory Visit (HOSPITAL_COMMUNITY): Payer: Self-pay

## 2021-11-13 ENCOUNTER — Other Ambulatory Visit (HOSPITAL_COMMUNITY): Payer: Self-pay

## 2022-01-22 ENCOUNTER — Telehealth: Payer: Self-pay | Admitting: Adult Health

## 2022-01-22 MED ORDER — AZITHROMYCIN 250 MG PO TABS: ORAL_TABLET | ORAL | 0 refills | Status: AC

## 2022-01-22 NOTE — Telephone Encounter (Signed)
Has semi productive cough, lungs clear, her husband checked them will rx  azithromycin 250 mg 2 po now and 1 daily for 4 days

## 2022-01-29 ENCOUNTER — Other Ambulatory Visit (HOSPITAL_COMMUNITY): Payer: Self-pay

## 2022-01-30 ENCOUNTER — Other Ambulatory Visit (HOSPITAL_COMMUNITY): Payer: Self-pay

## 2022-05-23 ENCOUNTER — Other Ambulatory Visit: Payer: Self-pay

## 2022-05-23 ENCOUNTER — Other Ambulatory Visit (HOSPITAL_COMMUNITY): Payer: Self-pay

## 2022-05-24 ENCOUNTER — Other Ambulatory Visit: Payer: Self-pay

## 2022-05-25 ENCOUNTER — Other Ambulatory Visit (HOSPITAL_COMMUNITY): Payer: Self-pay

## 2022-08-18 ENCOUNTER — Other Ambulatory Visit: Payer: Self-pay | Admitting: Adult Health

## 2022-08-18 ENCOUNTER — Other Ambulatory Visit (HOSPITAL_COMMUNITY): Payer: Self-pay

## 2022-08-20 ENCOUNTER — Other Ambulatory Visit: Payer: Self-pay

## 2022-08-20 ENCOUNTER — Other Ambulatory Visit (HOSPITAL_COMMUNITY): Payer: Self-pay

## 2022-08-20 ENCOUNTER — Encounter: Payer: Self-pay | Admitting: Adult Health

## 2022-08-20 MED ORDER — DUAVEE 0.45-20 MG PO TABS
1.0000 | ORAL_TABLET | Freq: Every day | ORAL | 3 refills | Status: DC
  Filled 2022-08-20: qty 90, 90d supply, fill #0
  Filled 2022-11-16: qty 90, 90d supply, fill #1
  Filled 2023-02-12: qty 90, 90d supply, fill #2
  Filled 2023-05-14: qty 90, 90d supply, fill #3

## 2022-08-21 ENCOUNTER — Other Ambulatory Visit: Payer: Self-pay

## 2022-08-22 ENCOUNTER — Other Ambulatory Visit: Payer: Self-pay

## 2022-08-22 ENCOUNTER — Other Ambulatory Visit (HOSPITAL_COMMUNITY): Payer: Self-pay

## 2022-11-14 ENCOUNTER — Other Ambulatory Visit (HOSPITAL_COMMUNITY): Payer: Self-pay | Admitting: Adult Health

## 2022-11-14 DIAGNOSIS — Z1231 Encounter for screening mammogram for malignant neoplasm of breast: Secondary | ICD-10-CM

## 2022-11-16 ENCOUNTER — Other Ambulatory Visit (HOSPITAL_COMMUNITY): Payer: Self-pay

## 2022-11-16 ENCOUNTER — Other Ambulatory Visit: Payer: Self-pay

## 2022-11-16 ENCOUNTER — Other Ambulatory Visit: Payer: Self-pay | Admitting: Adult Health

## 2022-11-16 MED ORDER — HYDROCHLOROTHIAZIDE 12.5 MG PO CAPS
12.5000 mg | ORAL_CAPSULE | Freq: Two times a day (BID) | ORAL | 4 refills | Status: DC
  Filled 2022-11-16: qty 180, 90d supply, fill #0
  Filled 2023-08-27 – 2023-09-03 (×2): qty 180, 90d supply, fill #1

## 2022-11-21 ENCOUNTER — Ambulatory Visit (HOSPITAL_COMMUNITY)
Admission: RE | Admit: 2022-11-21 | Discharge: 2022-11-21 | Disposition: A | Discharge status: Home / Self Care | Payer: 59 | Source: Ambulatory Visit | Attending: Adult Health | Admitting: Adult Health

## 2022-11-21 ENCOUNTER — Encounter (HOSPITAL_COMMUNITY): Payer: Self-pay

## 2022-11-21 DIAGNOSIS — Z1231 Encounter for screening mammogram for malignant neoplasm of breast: Secondary | ICD-10-CM | POA: Diagnosis not present

## 2023-02-12 ENCOUNTER — Other Ambulatory Visit: Payer: Self-pay

## 2023-05-03 IMAGING — MG MM DIGITAL DIAGNOSTIC UNILAT*L* W/ TOMO W/ CAD
6 series · 6 of 18 positions shown · non-contrast
Comparison: Previous exams.

CLINICAL DATA: Screening recall for possible left breast asymmetry.
Patient does take hormone replacement therapy.

EXAM:
DIGITAL DIAGNOSTIC UNILATERAL LEFT MAMMOGRAM WITH TOMOSYNTHESIS AND
CAD
TECHNIQUE: Left digital diagnostic mammography and breast tomosynthesis was
performed. The images were evaluated with computer-aided detection.

[L CC synth-2D]
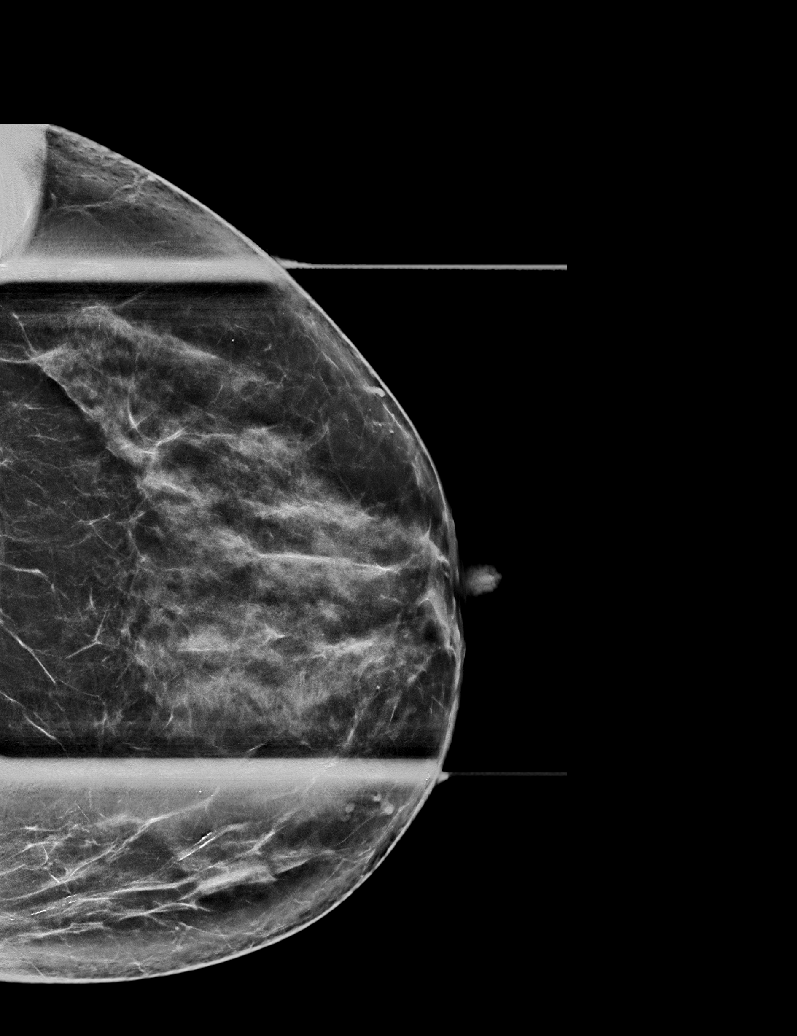

[L MLO synth-2D (1 of 2)]
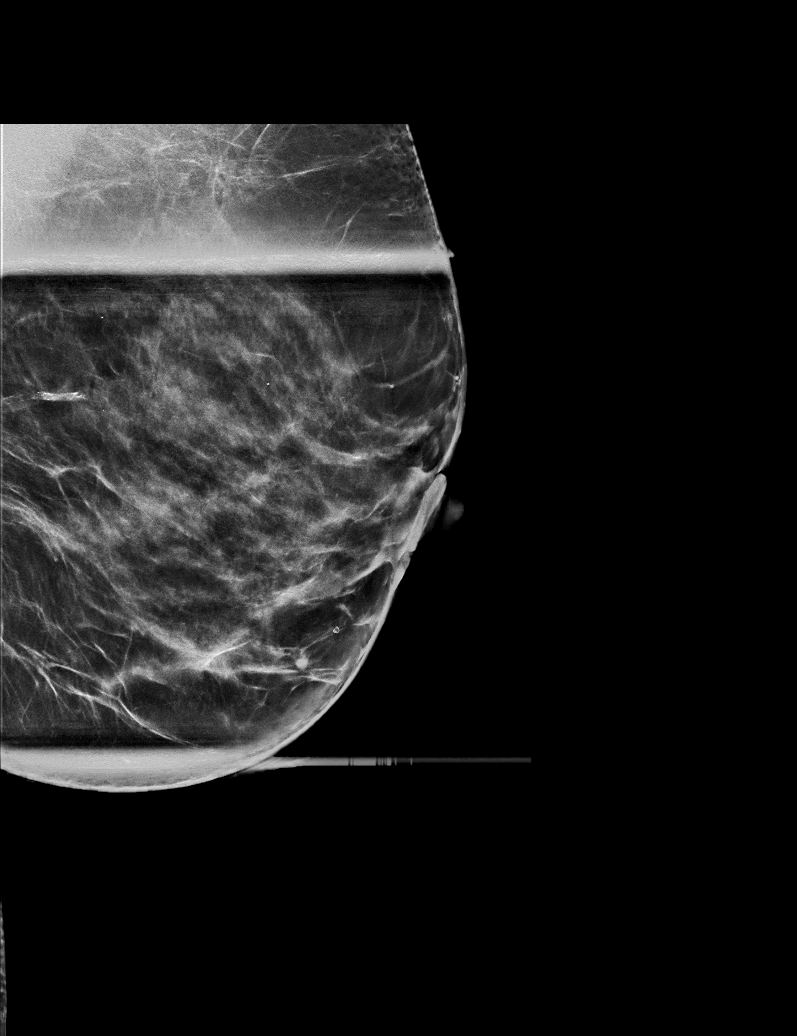

[L MLO synth-2D (2 of 2)]
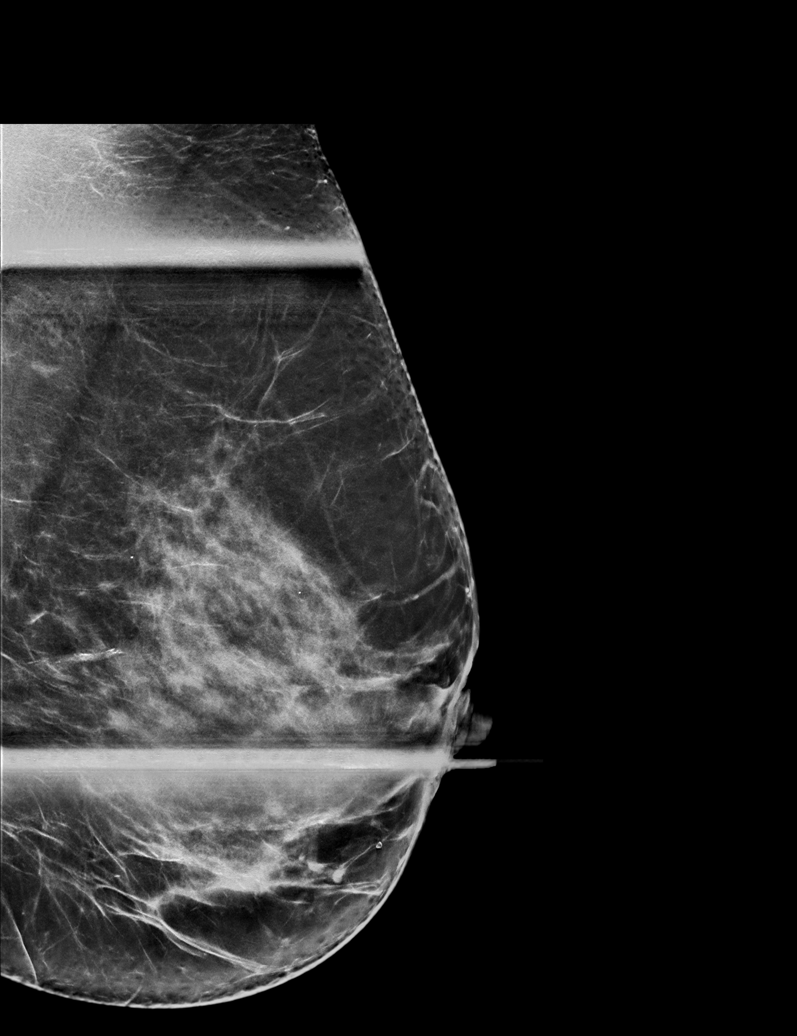

[L MLO tomo (1 of 2) · tomo slice 35/69.0]
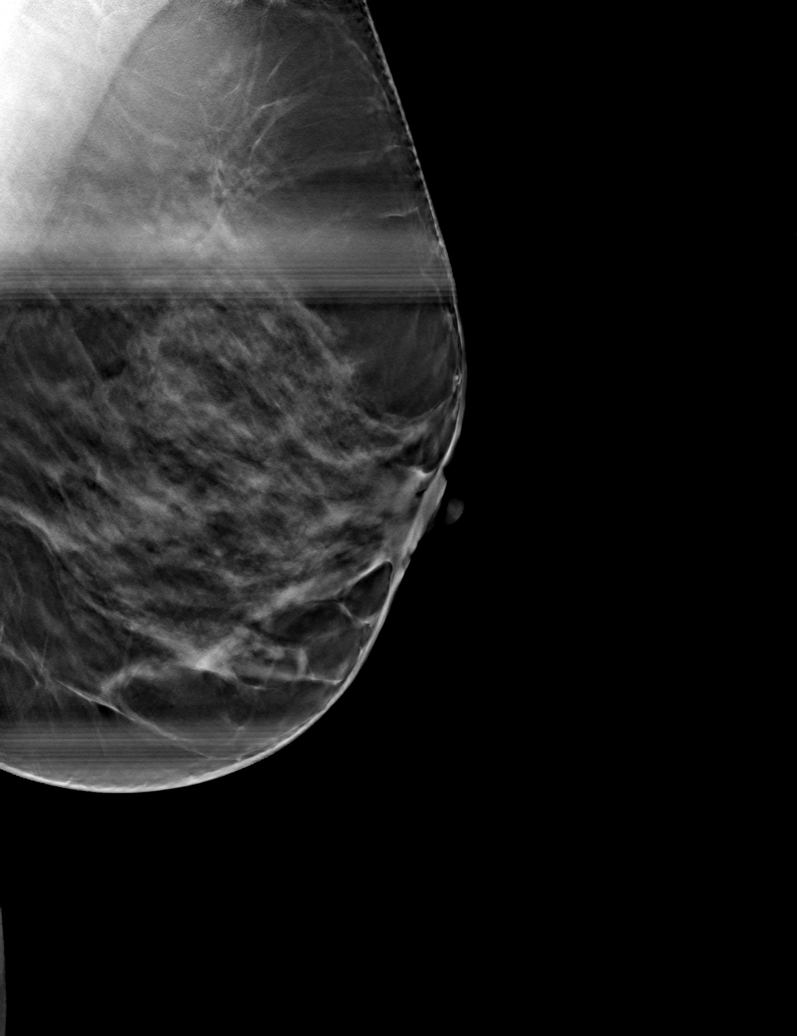

[L MLO tomo (2 of 2) · tomo slice 39/78.0]
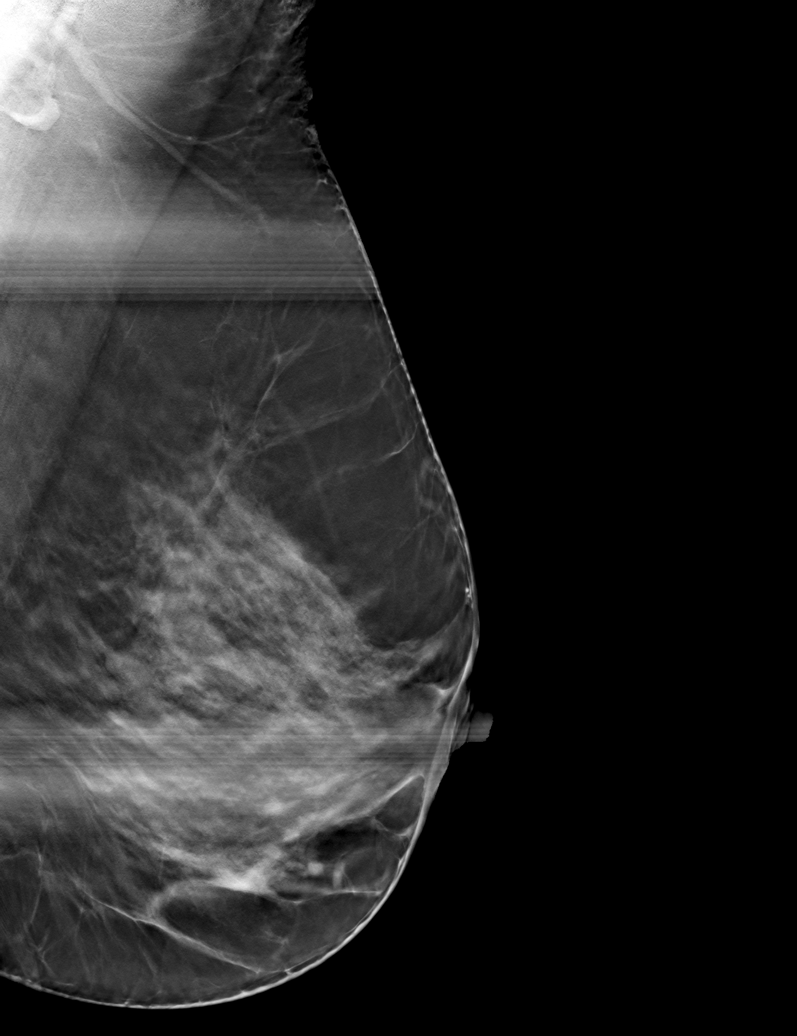

[L CC tomo · tomo slice 37/72.0]
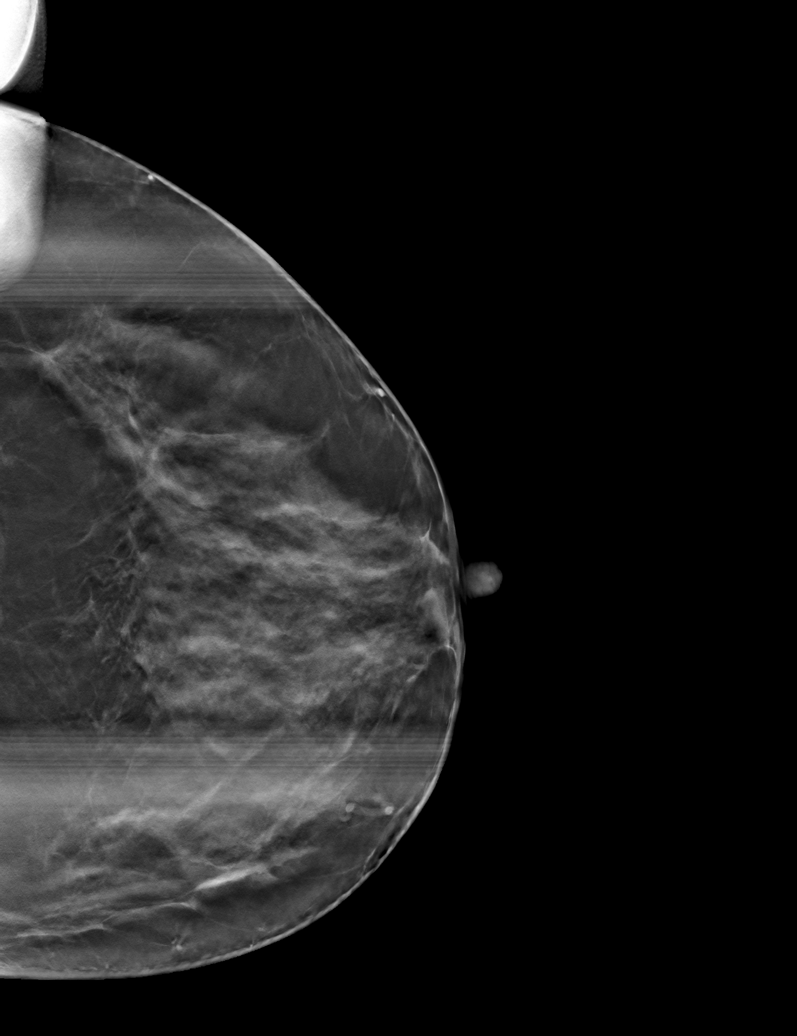

[6 of 18 positions shown; findings below may reference images not displayed]

ACR Breast Density Category c: The breast tissue is heterogeneously
dense, which may obscure small masses.
FINDINGS: Spot compression tomograms were performed the left breast. The
initially questioned possible left breast asymmetry resolves on the
additional imaging with findings compatible with an area of
dense/overlapping fibroglandular tissue. There is no mammographic
evidence of malignancy in the left breast.
IMPRESSION: No mammographic evidence of malignancy in the left breast.

RECOMMENDATION:
Screening mammogram in one year.(Code:RX-E-0TC)

I have discussed the findings and recommendations with the patient.
If applicable, a reminder letter will be sent to the patient
regarding the next appointment.

BI-RADS CATEGORY  1: Negative.

## 2023-05-14 ENCOUNTER — Other Ambulatory Visit: Payer: Self-pay

## 2023-05-14 ENCOUNTER — Other Ambulatory Visit (HOSPITAL_COMMUNITY): Payer: Self-pay

## 2023-08-23 ENCOUNTER — Other Ambulatory Visit: Payer: Self-pay

## 2023-08-23 ENCOUNTER — Other Ambulatory Visit: Payer: Self-pay | Admitting: Adult Health

## 2023-08-23 MED ORDER — DUAVEE 0.45-20 MG PO TABS
1.0000 | ORAL_TABLET | Freq: Every day | ORAL | 0 refills | Status: DC
  Filled 2023-08-23: qty 90, 90d supply, fill #0

## 2023-08-28 ENCOUNTER — Other Ambulatory Visit (HOSPITAL_COMMUNITY): Payer: Self-pay

## 2023-09-03 ENCOUNTER — Other Ambulatory Visit (HOSPITAL_COMMUNITY): Payer: Self-pay

## 2023-11-25 ENCOUNTER — Other Ambulatory Visit: Payer: Self-pay

## 2023-11-25 ENCOUNTER — Other Ambulatory Visit: Payer: Self-pay | Admitting: Adult Health

## 2023-11-25 MED ORDER — HYDROCHLOROTHIAZIDE 12.5 MG PO CAPS
12.5000 mg | ORAL_CAPSULE | Freq: Two times a day (BID) | ORAL | 4 refills | Status: AC
  Filled 2023-11-25: qty 180, 90d supply, fill #0

## 2023-11-25 MED ORDER — DUAVEE 0.45-20 MG PO TABS
1.0000 | ORAL_TABLET | Freq: Every day | ORAL | 0 refills | Status: DC
  Filled 2023-11-25: qty 90, 90d supply, fill #0

## 2023-11-26 ENCOUNTER — Other Ambulatory Visit: Payer: Self-pay

## 2024-01-06 ENCOUNTER — Encounter: Payer: Self-pay | Admitting: Adult Health

## 2024-02-11 ENCOUNTER — Other Ambulatory Visit: Payer: Self-pay | Admitting: Adult Health

## 2024-02-12 ENCOUNTER — Other Ambulatory Visit: Payer: Self-pay

## 2024-02-12 MED ORDER — DUAVEE 0.45-20 MG PO TABS
1.0000 | ORAL_TABLET | Freq: Every day | ORAL | 0 refills | Status: AC
  Filled 2024-02-12: qty 90, 90d supply, fill #0
